# Patient Record
Sex: Female | Born: 2000 | Race: White | Hispanic: No | Marital: Single | Smoking: Never smoker
Health system: Southern US, Community
[De-identification: ages and names within clinical notes are randomized; demographics above are authoritative.]

---
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
## 2018-01-01 DIAGNOSIS — R350 Frequency of micturition: Secondary | ICD-10-CM | POA: Diagnosis not present

## 2018-05-31 DIAGNOSIS — Z23 Encounter for immunization: Secondary | ICD-10-CM | POA: Diagnosis not present

## 2018-09-17 DIAGNOSIS — Z01419 Encounter for gynecological examination (general) (routine) without abnormal findings: Secondary | ICD-10-CM | POA: Diagnosis not present

## 2018-09-17 DIAGNOSIS — Z6820 Body mass index (BMI) 20.0-20.9, adult: Secondary | ICD-10-CM | POA: Diagnosis not present

## 2018-10-03 DIAGNOSIS — J029 Acute pharyngitis, unspecified: Secondary | ICD-10-CM | POA: Diagnosis not present

## 2022-03-22 DIAGNOSIS — F4323 Adjustment disorder with mixed anxiety and depressed mood: Secondary | ICD-10-CM | POA: Diagnosis not present

## 2022-04-15 DIAGNOSIS — F4323 Adjustment disorder with mixed anxiety and depressed mood: Secondary | ICD-10-CM | POA: Diagnosis not present

## 2022-04-20 DIAGNOSIS — N760 Acute vaginitis: Secondary | ICD-10-CM | POA: Diagnosis not present

## 2022-04-20 DIAGNOSIS — N921 Excessive and frequent menstruation with irregular cycle: Secondary | ICD-10-CM | POA: Diagnosis not present

## 2022-04-20 DIAGNOSIS — Z113 Encounter for screening for infections with a predominantly sexual mode of transmission: Secondary | ICD-10-CM | POA: Diagnosis not present

## 2022-05-23 DIAGNOSIS — F4323 Adjustment disorder with mixed anxiety and depressed mood: Secondary | ICD-10-CM | POA: Diagnosis not present

## 2022-06-09 DIAGNOSIS — F4323 Adjustment disorder with mixed anxiety and depressed mood: Secondary | ICD-10-CM | POA: Diagnosis not present

## 2022-06-20 DIAGNOSIS — Z6822 Body mass index (BMI) 22.0-22.9, adult: Secondary | ICD-10-CM | POA: Diagnosis not present

## 2022-06-20 DIAGNOSIS — Z113 Encounter for screening for infections with a predominantly sexual mode of transmission: Secondary | ICD-10-CM | POA: Diagnosis not present

## 2022-06-20 DIAGNOSIS — Z01419 Encounter for gynecological examination (general) (routine) without abnormal findings: Secondary | ICD-10-CM | POA: Diagnosis not present

## 2022-06-21 DIAGNOSIS — F4323 Adjustment disorder with mixed anxiety and depressed mood: Secondary | ICD-10-CM | POA: Diagnosis not present

## 2022-07-04 DIAGNOSIS — F4323 Adjustment disorder with mixed anxiety and depressed mood: Secondary | ICD-10-CM | POA: Diagnosis not present

## 2022-07-19 DIAGNOSIS — F4323 Adjustment disorder with mixed anxiety and depressed mood: Secondary | ICD-10-CM | POA: Diagnosis not present

## 2022-07-26 DIAGNOSIS — Z79899 Other long term (current) drug therapy: Secondary | ICD-10-CM | POA: Diagnosis not present

## 2022-07-26 DIAGNOSIS — Z0000 Encounter for general adult medical examination without abnormal findings: Secondary | ICD-10-CM | POA: Diagnosis not present

## 2022-07-26 DIAGNOSIS — Z23 Encounter for immunization: Secondary | ICD-10-CM | POA: Diagnosis not present

## 2022-08-01 DIAGNOSIS — F4323 Adjustment disorder with mixed anxiety and depressed mood: Secondary | ICD-10-CM | POA: Diagnosis not present

## 2022-08-22 DIAGNOSIS — F4323 Adjustment disorder with mixed anxiety and depressed mood: Secondary | ICD-10-CM | POA: Diagnosis not present

## 2022-09-07 DIAGNOSIS — F4323 Adjustment disorder with mixed anxiety and depressed mood: Secondary | ICD-10-CM | POA: Diagnosis not present

## 2022-10-05 DIAGNOSIS — F4323 Adjustment disorder with mixed anxiety and depressed mood: Secondary | ICD-10-CM | POA: Diagnosis not present

## 2022-10-18 DIAGNOSIS — F4323 Adjustment disorder with mixed anxiety and depressed mood: Secondary | ICD-10-CM | POA: Diagnosis not present

## 2022-11-10 DIAGNOSIS — F4323 Adjustment disorder with mixed anxiety and depressed mood: Secondary | ICD-10-CM | POA: Diagnosis not present

## 2022-12-02 DIAGNOSIS — F4323 Adjustment disorder with mixed anxiety and depressed mood: Secondary | ICD-10-CM | POA: Diagnosis not present

## 2022-12-19 DIAGNOSIS — F4323 Adjustment disorder with mixed anxiety and depressed mood: Secondary | ICD-10-CM | POA: Diagnosis not present

## 2023-01-03 DIAGNOSIS — F4323 Adjustment disorder with mixed anxiety and depressed mood: Secondary | ICD-10-CM | POA: Diagnosis not present

## 2023-02-13 DIAGNOSIS — F4323 Adjustment disorder with mixed anxiety and depressed mood: Secondary | ICD-10-CM | POA: Diagnosis not present

## 2023-02-28 DIAGNOSIS — F4323 Adjustment disorder with mixed anxiety and depressed mood: Secondary | ICD-10-CM | POA: Diagnosis not present

## 2023-03-15 DIAGNOSIS — F4323 Adjustment disorder with mixed anxiety and depressed mood: Secondary | ICD-10-CM | POA: Diagnosis not present

## 2023-03-28 DIAGNOSIS — F4323 Adjustment disorder with mixed anxiety and depressed mood: Secondary | ICD-10-CM | POA: Diagnosis not present

## 2023-04-19 DIAGNOSIS — F4323 Adjustment disorder with mixed anxiety and depressed mood: Secondary | ICD-10-CM | POA: Diagnosis not present

## 2023-05-01 DIAGNOSIS — F4323 Adjustment disorder with mixed anxiety and depressed mood: Secondary | ICD-10-CM | POA: Diagnosis not present

## 2023-05-15 DIAGNOSIS — F4323 Adjustment disorder with mixed anxiety and depressed mood: Secondary | ICD-10-CM | POA: Diagnosis not present

## 2023-05-30 DIAGNOSIS — F4323 Adjustment disorder with mixed anxiety and depressed mood: Secondary | ICD-10-CM | POA: Diagnosis not present

## 2023-06-13 DIAGNOSIS — F4323 Adjustment disorder with mixed anxiety and depressed mood: Secondary | ICD-10-CM | POA: Diagnosis not present

## 2023-06-27 DIAGNOSIS — F4323 Adjustment disorder with mixed anxiety and depressed mood: Secondary | ICD-10-CM | POA: Diagnosis not present

## 2023-07-14 DIAGNOSIS — F4323 Adjustment disorder with mixed anxiety and depressed mood: Secondary | ICD-10-CM | POA: Diagnosis not present

## 2023-07-31 DIAGNOSIS — F4323 Adjustment disorder with mixed anxiety and depressed mood: Secondary | ICD-10-CM | POA: Diagnosis not present

## 2023-08-21 DIAGNOSIS — F4323 Adjustment disorder with mixed anxiety and depressed mood: Secondary | ICD-10-CM | POA: Diagnosis not present

## 2023-08-30 DIAGNOSIS — Z113 Encounter for screening for infections with a predominantly sexual mode of transmission: Secondary | ICD-10-CM | POA: Diagnosis not present

## 2023-08-30 DIAGNOSIS — Z6825 Body mass index (BMI) 25.0-25.9, adult: Secondary | ICD-10-CM | POA: Diagnosis not present

## 2023-08-30 DIAGNOSIS — R635 Abnormal weight gain: Secondary | ICD-10-CM | POA: Diagnosis not present

## 2023-08-30 DIAGNOSIS — Z01411 Encounter for gynecological examination (general) (routine) with abnormal findings: Secondary | ICD-10-CM | POA: Diagnosis not present

## 2023-09-04 DIAGNOSIS — F4323 Adjustment disorder with mixed anxiety and depressed mood: Secondary | ICD-10-CM | POA: Diagnosis not present

## 2023-10-02 DIAGNOSIS — F4323 Adjustment disorder with mixed anxiety and depressed mood: Secondary | ICD-10-CM | POA: Diagnosis not present

## 2023-10-17 DIAGNOSIS — F4323 Adjustment disorder with mixed anxiety and depressed mood: Secondary | ICD-10-CM | POA: Diagnosis not present

## 2023-10-24 DIAGNOSIS — Z79899 Other long term (current) drug therapy: Secondary | ICD-10-CM | POA: Diagnosis not present

## 2023-10-24 DIAGNOSIS — Z0000 Encounter for general adult medical examination without abnormal findings: Secondary | ICD-10-CM | POA: Diagnosis not present

## 2023-10-24 DIAGNOSIS — Z13220 Encounter for screening for lipoid disorders: Secondary | ICD-10-CM | POA: Diagnosis not present

## 2023-10-24 DIAGNOSIS — E559 Vitamin D deficiency, unspecified: Secondary | ICD-10-CM | POA: Diagnosis not present

## 2023-10-24 DIAGNOSIS — Z130 Encounter for screening for diseases of the blood and blood-forming organs and certain disorders involving the immune mechanism: Secondary | ICD-10-CM | POA: Diagnosis not present

## 2023-10-24 DIAGNOSIS — Z23 Encounter for immunization: Secondary | ICD-10-CM | POA: Diagnosis not present

## 2023-10-24 DIAGNOSIS — Z131 Encounter for screening for diabetes mellitus: Secondary | ICD-10-CM | POA: Diagnosis not present

## 2023-10-31 DIAGNOSIS — F4323 Adjustment disorder with mixed anxiety and depressed mood: Secondary | ICD-10-CM | POA: Diagnosis not present

## 2023-11-13 DIAGNOSIS — F4323 Adjustment disorder with mixed anxiety and depressed mood: Secondary | ICD-10-CM | POA: Diagnosis not present

## 2023-11-27 DIAGNOSIS — F4323 Adjustment disorder with mixed anxiety and depressed mood: Secondary | ICD-10-CM | POA: Diagnosis not present

## 2023-12-21 DIAGNOSIS — F4323 Adjustment disorder with mixed anxiety and depressed mood: Secondary | ICD-10-CM | POA: Diagnosis not present

## 2024-01-03 DIAGNOSIS — F4323 Adjustment disorder with mixed anxiety and depressed mood: Secondary | ICD-10-CM | POA: Diagnosis not present

## 2024-02-02 DIAGNOSIS — F4323 Adjustment disorder with mixed anxiety and depressed mood: Secondary | ICD-10-CM | POA: Diagnosis not present

## 2024-02-15 DIAGNOSIS — F4323 Adjustment disorder with mixed anxiety and depressed mood: Secondary | ICD-10-CM | POA: Diagnosis not present

## 2024-03-07 DIAGNOSIS — F4323 Adjustment disorder with mixed anxiety and depressed mood: Secondary | ICD-10-CM | POA: Diagnosis not present

## 2024-03-20 DIAGNOSIS — F4323 Adjustment disorder with mixed anxiety and depressed mood: Secondary | ICD-10-CM | POA: Diagnosis not present

## 2024-04-09 DIAGNOSIS — F4323 Adjustment disorder with mixed anxiety and depressed mood: Secondary | ICD-10-CM | POA: Diagnosis not present

## 2024-04-18 DIAGNOSIS — M6281 Muscle weakness (generalized): Secondary | ICD-10-CM | POA: Diagnosis not present

## 2024-04-23 DIAGNOSIS — M25512 Pain in left shoulder: Secondary | ICD-10-CM | POA: Diagnosis not present

## 2024-04-23 DIAGNOSIS — M5412 Radiculopathy, cervical region: Secondary | ICD-10-CM | POA: Diagnosis not present

## 2024-04-24 DIAGNOSIS — F4323 Adjustment disorder with mixed anxiety and depressed mood: Secondary | ICD-10-CM | POA: Diagnosis not present

## 2024-04-29 DIAGNOSIS — M542 Cervicalgia: Secondary | ICD-10-CM | POA: Diagnosis not present

## 2024-04-29 DIAGNOSIS — G8322 Monoplegia of upper limb affecting left dominant side: Secondary | ICD-10-CM | POA: Diagnosis not present

## 2024-04-29 DIAGNOSIS — M5412 Radiculopathy, cervical region: Secondary | ICD-10-CM | POA: Diagnosis not present

## 2024-05-02 DIAGNOSIS — R29898 Other symptoms and signs involving the musculoskeletal system: Secondary | ICD-10-CM | POA: Diagnosis not present

## 2024-05-02 DIAGNOSIS — M4802 Spinal stenosis, cervical region: Secondary | ICD-10-CM | POA: Diagnosis not present

## 2024-05-02 DIAGNOSIS — M542 Cervicalgia: Secondary | ICD-10-CM | POA: Diagnosis not present

## 2024-05-02 DIAGNOSIS — M50222 Other cervical disc displacement at C5-C6 level: Secondary | ICD-10-CM | POA: Diagnosis not present

## 2024-05-09 DIAGNOSIS — F4323 Adjustment disorder with mixed anxiety and depressed mood: Secondary | ICD-10-CM | POA: Diagnosis not present

## 2024-05-13 DIAGNOSIS — M5412 Radiculopathy, cervical region: Secondary | ICD-10-CM | POA: Diagnosis not present

## 2024-05-13 DIAGNOSIS — M50221 Other cervical disc displacement at C4-C5 level: Secondary | ICD-10-CM | POA: Diagnosis not present

## 2024-05-13 DIAGNOSIS — G8322 Monoplegia of upper limb affecting left dominant side: Secondary | ICD-10-CM | POA: Diagnosis not present

## 2024-05-22 DIAGNOSIS — F4323 Adjustment disorder with mixed anxiety and depressed mood: Secondary | ICD-10-CM | POA: Diagnosis not present

## 2024-06-05 DIAGNOSIS — F4323 Adjustment disorder with mixed anxiety and depressed mood: Secondary | ICD-10-CM | POA: Diagnosis not present

## 2024-07-09 DIAGNOSIS — F4323 Adjustment disorder with mixed anxiety and depressed mood: Secondary | ICD-10-CM | POA: Diagnosis not present

## 2024-07-10 DIAGNOSIS — M542 Cervicalgia: Secondary | ICD-10-CM | POA: Diagnosis not present

## 2024-07-10 DIAGNOSIS — Z79891 Long term (current) use of opiate analgesic: Secondary | ICD-10-CM | POA: Diagnosis not present

## 2024-07-10 DIAGNOSIS — M5412 Radiculopathy, cervical region: Secondary | ICD-10-CM | POA: Diagnosis not present

## 2024-08-02 DIAGNOSIS — M5382 Other specified dorsopathies, cervical region: Secondary | ICD-10-CM | POA: Diagnosis not present

## 2024-08-02 DIAGNOSIS — M542 Cervicalgia: Secondary | ICD-10-CM | POA: Diagnosis not present

## 2024-08-02 DIAGNOSIS — Z7409 Other reduced mobility: Secondary | ICD-10-CM | POA: Diagnosis not present

## 2024-08-02 DIAGNOSIS — M6281 Muscle weakness (generalized): Secondary | ICD-10-CM | POA: Diagnosis not present

## 2024-08-05 DIAGNOSIS — F4323 Adjustment disorder with mixed anxiety and depressed mood: Secondary | ICD-10-CM | POA: Diagnosis not present

## 2024-08-26 DIAGNOSIS — F4323 Adjustment disorder with mixed anxiety and depressed mood: Secondary | ICD-10-CM | POA: Diagnosis not present

## 2024-08-30 DIAGNOSIS — F331 Major depressive disorder, recurrent, moderate: Secondary | ICD-10-CM | POA: Diagnosis not present

## 2024-09-02 DIAGNOSIS — M4802 Spinal stenosis, cervical region: Secondary | ICD-10-CM | POA: Diagnosis not present

## 2024-09-02 DIAGNOSIS — M5412 Radiculopathy, cervical region: Secondary | ICD-10-CM | POA: Diagnosis not present

## 2024-09-02 DIAGNOSIS — M2578 Osteophyte, vertebrae: Secondary | ICD-10-CM | POA: Diagnosis not present

## 2024-09-02 DIAGNOSIS — M5023 Other cervical disc displacement, cervicothoracic region: Secondary | ICD-10-CM | POA: Diagnosis not present

## 2024-09-10 DIAGNOSIS — F4323 Adjustment disorder with mixed anxiety and depressed mood: Secondary | ICD-10-CM | POA: Diagnosis not present
